# Patient Record
Sex: Male | Born: 1950 | Race: Asian | Hispanic: No | Marital: Married | Smoking: Never smoker
Health system: Southern US, Community
[De-identification: ages and names within clinical notes are randomized; demographics above are authoritative.]

## PROBLEM LIST (undated history)

## (undated) DIAGNOSIS — K635 Polyp of colon: Secondary | ICD-10-CM

## (undated) DIAGNOSIS — K219 Gastro-esophageal reflux disease without esophagitis: Secondary | ICD-10-CM

## (undated) DIAGNOSIS — I10 Essential (primary) hypertension: Secondary | ICD-10-CM

## (undated) HISTORY — DX: Polyp of colon: K63.5

---

## 2009-03-28 ENCOUNTER — Emergency Department (HOSPITAL_COMMUNITY): Admission: EM | Admit: 2009-03-28 | Discharge: 2009-03-28 | Payer: Self-pay | Admitting: Emergency Medicine

## 2012-03-22 ENCOUNTER — Emergency Department (HOSPITAL_BASED_OUTPATIENT_CLINIC_OR_DEPARTMENT_OTHER)
Admission: EM | Admit: 2012-03-22 | Discharge: 2012-03-22 | Disposition: A | Discharge status: Home / Self Care | Payer: Worker's Compensation | Attending: Emergency Medicine | Admitting: Emergency Medicine

## 2012-03-22 ENCOUNTER — Encounter (HOSPITAL_BASED_OUTPATIENT_CLINIC_OR_DEPARTMENT_OTHER): Payer: Self-pay

## 2012-03-22 DIAGNOSIS — S61209A Unspecified open wound of unspecified finger without damage to nail, initial encounter: Secondary | ICD-10-CM | POA: Insufficient documentation

## 2012-03-22 DIAGNOSIS — K219 Gastro-esophageal reflux disease without esophagitis: Secondary | ICD-10-CM | POA: Insufficient documentation

## 2012-03-22 DIAGNOSIS — Y93G1 Activity, food preparation and clean up: Secondary | ICD-10-CM | POA: Insufficient documentation

## 2012-03-22 DIAGNOSIS — I10 Essential (primary) hypertension: Secondary | ICD-10-CM | POA: Insufficient documentation

## 2012-03-22 DIAGNOSIS — S61211A Laceration without foreign body of left index finger without damage to nail, initial encounter: Secondary | ICD-10-CM

## 2012-03-22 DIAGNOSIS — Y998 Other external cause status: Secondary | ICD-10-CM | POA: Insufficient documentation

## 2012-03-22 DIAGNOSIS — W260XXA Contact with knife, initial encounter: Secondary | ICD-10-CM | POA: Insufficient documentation

## 2012-03-22 DIAGNOSIS — W261XXA Contact with sword or dagger, initial encounter: Secondary | ICD-10-CM | POA: Insufficient documentation

## 2012-03-22 HISTORY — DX: Essential (primary) hypertension: I10

## 2012-03-22 HISTORY — DX: Gastro-esophageal reflux disease without esophagitis: K21.9

## 2012-03-22 MED ORDER — HYDROCODONE-ACETAMINOPHEN 5-325 MG PO TABS: 2.0000 | ORAL_TABLET | ORAL | Status: AC | PRN

## 2012-03-22 MED ORDER — GELATIN ABSORBABLE 12-7 MM EX MISC
1.0000 | Freq: Once | CUTANEOUS | Status: AC
  Administered 2012-03-22: 1 via TOPICAL

## 2012-03-22 MED ORDER — GELATIN ABSORBABLE 12-7 MM EX MISC
CUTANEOUS | Status: AC
  Filled 2012-03-22: qty 1

## 2012-03-22 NOTE — ED Notes (Signed)
Laceration to index finger on left hand.

## 2012-03-22 NOTE — ED Notes (Signed)
Sofia PA at bedside.  

## 2012-03-22 NOTE — ED Provider Notes (Signed)
Medical screening examination/treatment/procedure(s) were performed by non-physician practitioner and as supervising physician I was immediately available for consultation/collaboration.   Loren Racer, MD 03/22/12 1451

## 2012-03-22 NOTE — ED Provider Notes (Signed)
History     CSN: [id number]  Arrival date & time 03/22/12  1335   First MD Initiated Contact with Patient 03/22/12 1359      Chief Complaint  Patient presents with  . Extremity Laceration    (Consider location/radiation/quality/duration/timing/severity/associated sxs/prior treatment) Patient is a 61 y.o. male presenting with hand pain. The history is provided by the patient. No language interpreter was used.  Hand Pain This is a new problem. The current episode started today. The problem has been gradually worsening. Nothing aggravates the symptoms. He has tried nothing for the symptoms. The treatment provided no relief.  Pt cut of tip of finger slicing vegetables.   Pt complains of bleeding from tip of finger.  Past Medical History  Diagnosis Date  . Hypertension   . GERD (gastroesophageal reflux disease)     History reviewed. No pertinent past surgical history.  No family history on file.  History  Substance Use Topics  . Smoking status: Never Smoker   . Smokeless tobacco: Not on file  . Alcohol Use: No      Review of Systems  Skin: Positive for wound.  All other systems reviewed and are negative.    Allergies  Review of patient's allergies indicates no known allergies.  Home Medications   Current Outpatient Rx  Name Route Sig Dispense Refill  . OMEPRAZOLE 20 MG PO CPDR Oral Take 20 mg by mouth daily.      BP 134/72  Pulse 59  Temp 98.3 F (36.8 C) (Oral)  Resp 16  Ht 5\' 3"  (1.6 m)  Wt 155 lb (70.308 kg)  BMI 27.46 kg/m2  SpO2 98%  Physical Exam  Nursing note and vitals reviewed. Constitutional: He appears well-developed and well-nourished.  Musculoskeletal:       7mm avulsed area tip of left index finger gapping, nv and ns intact  Neurological: He is alert.  Skin: Skin is warm.       7mm avulsion laceration distal left index finger  Psychiatric: He has a normal mood and affect.    ED Course  Procedures (including critical care  time)  Labs Reviewed - No data to display No results found.   No diagnosis found.    MDM  Gelfoam to tip of finger.         Lonia Skinner [place], [place] 03/22/12 1407  Lonia Skinner [place], [place] 03/22/12 [phone]

## 2015-05-13 ENCOUNTER — Encounter: Payer: Self-pay | Admitting: Family Medicine

## 2015-11-07 ENCOUNTER — Emergency Department (HOSPITAL_BASED_OUTPATIENT_CLINIC_OR_DEPARTMENT_OTHER): Payer: BLUE CROSS/BLUE SHIELD

## 2015-11-07 ENCOUNTER — Emergency Department (HOSPITAL_BASED_OUTPATIENT_CLINIC_OR_DEPARTMENT_OTHER)
Admission: EM | Admit: 2015-11-07 | Discharge: 2015-11-07 | Disposition: A | Discharge status: Home / Self Care | Payer: BLUE CROSS/BLUE SHIELD | Attending: Emergency Medicine | Admitting: Emergency Medicine

## 2015-11-07 ENCOUNTER — Encounter (HOSPITAL_BASED_OUTPATIENT_CLINIC_OR_DEPARTMENT_OTHER): Payer: Self-pay | Admitting: *Deleted

## 2015-11-07 DIAGNOSIS — I10 Essential (primary) hypertension: Secondary | ICD-10-CM | POA: Diagnosis not present

## 2015-11-07 DIAGNOSIS — K219 Gastro-esophageal reflux disease without esophagitis: Secondary | ICD-10-CM | POA: Insufficient documentation

## 2015-11-07 DIAGNOSIS — R1012 Left upper quadrant pain: Secondary | ICD-10-CM | POA: Diagnosis present

## 2015-11-07 DIAGNOSIS — Z79899 Other long term (current) drug therapy: Secondary | ICD-10-CM | POA: Diagnosis not present

## 2015-11-07 DIAGNOSIS — K2970 Gastritis, unspecified, without bleeding: Secondary | ICD-10-CM

## 2015-11-07 LAB — CBC WITH DIFFERENTIAL/PLATELET
BASOS PCT: 0 %
Basophils Absolute: 0 10*3/uL (ref 0.0–0.1)
Eosinophils Absolute: 0.1 10*3/uL (ref 0.0–0.7)
Eosinophils Relative: 1 %
HEMATOCRIT: 41.8 % (ref 39.0–52.0)
HEMOGLOBIN: 14.9 g/dL (ref 13.0–17.0)
LYMPHS ABS: 1.8 10*3/uL (ref 0.7–4.0)
Lymphocytes Relative: 32 %
MCH: 30 pg (ref 26.0–34.0)
MCHC: 35.6 g/dL (ref 30.0–36.0)
MCV: 84.3 fL (ref 78.0–100.0)
MONOS PCT: 8 %
Monocytes Absolute: 0.5 10*3/uL (ref 0.1–1.0)
NEUTROS ABS: 3.3 10*3/uL (ref 1.7–7.7)
NEUTROS PCT: 59 %
Platelets: 186 10*3/uL (ref 150–400)
RBC: 4.96 MIL/uL (ref 4.22–5.81)
RDW: 13 % (ref 11.5–15.5)
WBC: 5.6 10*3/uL (ref 4.0–10.5)

## 2015-11-07 LAB — COMPREHENSIVE METABOLIC PANEL
ALT: 26 U/L (ref 17–63)
ANION GAP: 9 (ref 5–15)
AST: 24 U/L (ref 15–41)
Albumin: 4.4 g/dL (ref 3.5–5.0)
Alkaline Phosphatase: 76 U/L (ref 38–126)
BUN: 12 mg/dL (ref 6–20)
CALCIUM: 8.6 mg/dL — AB (ref 8.9–10.3)
CHLORIDE: 104 mmol/L (ref 101–111)
CO2: 27 mmol/L (ref 22–32)
Creatinine, Ser: 0.67 mg/dL (ref 0.61–1.24)
GFR calc non Af Amer: >60 mL/min (ref >60)
Glucose, Bld: 114 mg/dL — ABNORMAL HIGH (ref 65–99)
POTASSIUM: 3.6 mmol/L (ref 3.5–5.1)
SODIUM: 140 mmol/L (ref 135–145)
Total Bilirubin: 0.7 mg/dL (ref 0.3–1.2)
Total Protein: 7.3 g/dL (ref 6.5–8.1)

## 2015-11-07 LAB — URINALYSIS, ROUTINE W REFLEX MICROSCOPIC
Bilirubin Urine: NEGATIVE
Glucose, UA: NEGATIVE mg/dL
HGB URINE DIPSTICK: NEGATIVE
Ketones, ur: NEGATIVE mg/dL
Leukocytes, UA: NEGATIVE
NITRITE: NEGATIVE
Protein, ur: NEGATIVE mg/dL
SPECIFIC GRAVITY, URINE: 1.004 — AB (ref 1.005–1.030)
pH: 7.5 (ref 5.0–8.0)

## 2015-11-07 LAB — LIPASE, BLOOD: Lipase: 25 U/L (ref 11–51)

## 2015-11-07 LAB — D-DIMER, QUANTITATIVE (NOT AT ARMC)

## 2015-11-07 MED ORDER — IOHEXOL 300 MG/ML  SOLN
25.0000 mL | Freq: Once | INTRAMUSCULAR | Status: AC | PRN
  Administered 2015-11-07: 25 mL via ORAL

## 2015-11-07 MED ORDER — FAMOTIDINE 20 MG PO TABS: 20.0000 mg | ORAL_TABLET | Freq: Two times a day (BID) | ORAL | Status: DC

## 2015-11-07 MED ORDER — IOHEXOL 300 MG/ML  SOLN
100.0000 mL | Freq: Once | INTRAMUSCULAR | Status: AC | PRN
  Administered 2015-11-07: 100 mL via INTRAVENOUS

## 2015-11-07 NOTE — ED Notes (Signed)
Denies any injury to abd/ chest area, onset of discomfort approx 2 days ago.

## 2015-11-07 NOTE — Discharge Instructions (Signed)
Gastritis, Adult Gastritis is soreness and swelling (inflammation) of the lining of the stomach. Gastritis can develop as a sudden onset (acute) or long-term (chronic) condition. If gastritis is not treated, it can lead to stomach bleeding and ulcers. CAUSES  Gastritis occurs when the stomach lining is weak or damaged. Digestive juices from the stomach then inflame the weakened stomach lining. The stomach lining may be weak or damaged due to viral or bacterial infections. One common bacterial infection is the Helicobacter pylori infection. Gastritis can also result from excessive alcohol consumption, taking certain medicines, or having too much acid in the stomach.  SYMPTOMS  In some cases, there are no symptoms. When symptoms are present, they may include:  Pain or a burning sensation in the upper abdomen.  Nausea.  Vomiting.  An uncomfortable feeling of fullness after eating. DIAGNOSIS  Your caregiver may suspect you have gastritis based on your symptoms and a physical exam. To determine the cause of your gastritis, your caregiver may perform the following:  Blood or stool tests to check for the H pylori bacterium.  Gastroscopy. A thin, flexible tube (endoscope) is passed down the esophagus and into the stomach. The endoscope has a light and camera on the end. Your caregiver uses the endoscope to view the inside of the stomach.  Taking a tissue sample (biopsy) from the stomach to examine under a microscope. TREATMENT  Depending on the cause of your gastritis, medicines may be prescribed. If you have a bacterial infection, such as an H pylori infection, antibiotics may be given. If your gastritis is caused by too much acid in the stomach, H2 blockers or antacids may be given. Your caregiver may recommend that you stop taking aspirin, ibuprofen, or other nonsteroidal anti-inflammatory drugs (NSAIDs). HOME CARE INSTRUCTIONS  Only take over-the-counter or prescription medicines as directed by  your caregiver.  If you were given antibiotic medicines, take them as directed. Finish them even if you start to feel better.  Drink enough fluids to keep your urine clear or pale yellow.  Avoid foods and drinks that make your symptoms worse, such as:  Caffeine or alcoholic drinks.  Chocolate.  Peppermint or mint flavorings.  Garlic and onions.  Spicy foods.  Citrus fruits, such as oranges, lemons, or limes.  Tomato-based foods such as sauce, chili, salsa, and pizza.  Fried and fatty foods.  Eat small, frequent meals instead of large meals. SEEK IMMEDIATE MEDICAL CARE IF:   You have black or dark red stools.  You vomit blood or material that looks like coffee grounds.  You are unable to keep fluids down.  Your abdominal pain gets worse.  You have a fever.  You do not feel better after 1 week.  You have any other questions or concerns. MAKE SURE YOU:  Understand these instructions.  Will watch your condition.  Will get help right away if you are not doing well or get worse.   This information is not intended to replace advice given to you by your health care provider. Make sure you discuss any questions you have with your health care provider.   Document Released: 08/23/2001 Document Revised: 02/28/2012 Document Reviewed: 10/12/2011 Elsevier Interactive Patient Education 2016 Else[employer]  Flank Pain Flank pain is pain in your side. The flank is the area of your side between your upper belly (abdomen) and your back. Pain in this area can be caused by many different things. [place] care and treatment will depend on the cause of your  pain.  Rest as told by your doctor.  Drink enough fluids to keep your pee (urine) clear or pale yellow.  Only take medicine as told by your doctor.  Tell your doctor about any changes in your pain.  Follow up with your doctor. GET HELP RIGHT AWAY IF:   Your pain does not get better with medicine.   You have  new symptoms or your symptoms get worse.  Your pain gets worse.   You have belly (abdominal) pain.   You are short of breath.   You always feel sick to your stomach (nauseous).   You keep throwing up (vomiting).   You have puffiness (swelling) in your belly.   You feel light-headed or you pass out (faint).   You have blood in your pee.  You have a fever or lasting symptoms for more than 2-3 days.  You have a fever and your symptoms suddenly get worse. MAKE SURE YOU:   Understand these instructions.  Will watch your condition.  Will get help right away if you are not doing well or get worse.   This information is not intended to replace advice given to you by your health care provider. Make sure you discuss any questions you have with your health care provider.   Document Released: 06/07/2008 Document Revised: 09/19/2014 Document Reviewed: 04/12/2012 Elsevier Interactive Patient Education [employer].

## 2015-11-07 NOTE — ED Notes (Signed)
Presents with pain at base of ribs, left side. Denies any N/V/ D, describes as "pain that is there, but not severe"

## 2015-11-07 NOTE — ED Notes (Signed)
DC instructions and Rx reviewed with pt and son. Opportunity for questions provided

## 2015-11-07 NOTE — ED Notes (Signed)
Appetite good, no changes noted

## 2015-11-07 NOTE — ED Notes (Signed)
MD at bedside. 

## 2015-11-07 NOTE — ED Provider Notes (Signed)
CSN: [id number]     Arrival date & time 11/07/15  [id number] History   First MD Initiated Contact with Patient 11/07/15 [phone]     Chief Complaint  Patient presents with  . Abdominal Pain     (Consider location/radiation/quality/duration/timing/severity/associated sxs/prior Treatment) HPI Comments: Patient with a history of hypertension and gastroesophageal reflux disease presents with abdominal pain. He does speak [place] but his son also helps interpret some questions. He has pain in his left lower rib cage/upper abdomen. He states it started about a day and half ago. He states it's gradually gotten worse but he still states the pain is mild. It's not related to breathing. He has no shortness of breath. No cough or chest congestion. He has no associated nausea or vomiting. He's having normal bowel movements. He denies any fevers. No urinary symptoms. It's not related to eating. He denies any leg pain or swelling. There is no recent travel history or immobilization. No prior history of blood clots. No smoking history. He denies any injuries to the area.  Patient is a 65 y.o. male presenting with abdominal pain.  Abdominal Pain Associated symptoms: no chest pain, no chills, no cough, no diarrhea, no fatigue, no fever, no hematuria, no nausea, no shortness of breath and no vomiting     Past Medical History  Diagnosis Date  . Hypertension   . GERD (gastroesophageal reflux disease)    History reviewed. No pertinent past surgical history. No family history on file. Social History  Substance Use Topics  . Smoking status: Never Smoker   . Smokeless tobacco: None  . Alcohol Use: No    Review of Systems  Constitutional: Negative for fever, chills, diaphoresis and fatigue.  HENT: Negative for congestion, rhinorrhea and sneezing.   Eyes: Negative.   Respiratory: Negative for cough, chest tightness and shortness of breath.   Cardiovascular: Negative for chest pain and leg swelling.   Gastrointestinal: Positive for abdominal pain. Negative for nausea, vomiting, diarrhea and blood in stool.  Genitourinary: Negative for frequency, hematuria, flank pain and difficulty urinating.  Musculoskeletal: Negative for back pain and arthralgias.  Skin: Negative for rash.  Neurological: Negative for dizziness, speech difficulty, weakness, numbness and headaches.      Allergies  Review of patient's allergies indicates no known allergies.  Home Medications   Prior to Admission medications   Medication Sig Start Date End Date Taking? Authorizing Provider  Vitamin D, Ergocalciferol, (DRISDOL) 50000 units CAPS capsule Take 50,000 Units by mouth every 7 (seven) days.   Yes Historical Provider, MD  famotidine (PEPCID) 20 MG tablet Take 1 tablet (20 mg total) by mouth 2 (two) times daily. 11/07/15   Malvin Johns, MD  omeprazole (PRILOSEC) 20 MG capsule Take 20 mg by mouth daily.    Historical Provider, MD   BP 143/87 mmHg  Pulse 60  Temp(Src) 98 F (36.7 C) (Oral)  Resp 18  Ht 5\' 6"  (1.676 m)  Wt 155 lb (70.308 kg)  BMI 25.03 kg/m2  SpO2 98% Physical Exam  Constitutional: He is oriented to person, place, and time. He appears well-developed and well-nourished.  HENT:  Head: Normocephalic and atraumatic.  Eyes: Pupils are equal, round, and reactive to light.  Neck: Normal range of motion. Neck supple.  Cardiovascular: Normal rate, regular rhythm and normal heart sounds.   Pulmonary/Chest: Effort normal and breath sounds normal. No respiratory distress. He has no wheezes. He has no rales. He exhibits tenderness (Moderate tenderness to the left lower anterior ribs. No crepitus  or deformity. No signs of external trauma).  Abdominal: Soft. Bowel sounds are normal. There is tenderness (Mild tenderness to the left upper quadrant just under the rib cage.). There is no rebound and no guarding.  Musculoskeletal: Normal range of motion. He exhibits no edema.  Lymphadenopathy:    He has no  cervical adenopathy.  Neurological: He is alert and oriented to person, place, and time.  Skin: Skin is warm and dry. No rash noted.  Psychiatric: He has a normal mood and affect.    ED Course  Procedures (including critical care time) Labs Review Labs Reviewed  COMPREHENSIVE METABOLIC PANEL - Abnormal; Notable for the following:    Glucose, Bld 114 (*)    Calcium 8.6 (*)    All other components within normal limits  URINALYSIS, ROUTINE W REFLEX MICROSCOPIC (NOT AT [facility]) - Abnormal; Notable for the following:    Specific Gravity, Urine 1.004 (*)    All other components within normal limits  CBC WITH DIFFERENTIAL/PLATELET  LIPASE, BLOOD  D-DIMER, QUANTITATIVE (NOT AT [facility])    Imaging Review Dg Ribs Unilateral W/chest Left  11/07/2015  CLINICAL DATA:  Left anterior lower rib pain for 2 days. EXAM: RIGHT RIBS AND CHEST - 3+ VIEW COMPARISON:  None. FINDINGS: Heart is normal size. Tortuosity of the thoracic aorta. Lungs are clear. No effusions or pneumothorax. No visible rib fracture. IMPRESSION: No acute findings. Electronically Signed   By: Rolm Baptise M.D.   On: 11/07/2015 09:43   Ct Abdomen Pelvis W Contrast  11/07/2015  CLINICAL DATA:  Left upper quadrant pain for 2 days. EXAM: CT ABDOMEN AND PELVIS WITH CONTRAST TECHNIQUE: Multidetector CT imaging of the abdomen and pelvis was performed using the standard protocol following bolus administration of intravenous contrast. CONTRAST:  19mL OMNIPAQUE IOHEXOL 300 MG/ML SOLN, 91mL OMNIPAQUE IOHEXOL 300 MG/ML SOLN COMPARISON:  None. FINDINGS: Lower chest:  Lung bases are clear. Hepatobiliary: Multiple punctate low-density structures throughout the liver. Many are too small to characterize but probably represent tiny cysts. Normal appearance of the gallbladder and there is no significant biliary dilatation. Portal venous system is patent. Pancreas: Normal appearance of the pancreas without inflammation or duct dilatation. Spleen: Normal  appearance of the spleen without enlargement. Adrenals/Urinary Tract: Normal adrenal glands. 1.8 cm exophytic cyst in the right kidney upper pole. 1 cm cystic structure in the right kidney interpolar region is slightly dense. 0.7 cm stone in the right kidney lower pole without hydronephrosis. Normal appearance of the urinary bladder. Evidence for multiple small cysts in left kidney without hydronephrosis or stones. Stomach/Bowel: Mild wall thickening at the gastric cardia but this could be related to incomplete distention. However, the distal stomach and duodenal bulb region are distended and there is mild wall thickening, measuring close to 4 mm. There is no significant edema or stranding around the duodenal bulb. Vascular/Lymphatic: No significant atherosclerotic disease in the aorta or iliac arteries. There is no significant abdominal or pelvic lymphadenopathy. Reproductive: No gross abnormality to the prostate or seminal vesicles. Other: No free fluid.  No free air. Musculoskeletal:  No suspicious bone findings. IMPRESSION: Mild wall thickening in the region the distal stomach and duodenal bulb. There may also be mild wall thickening near the gastric cardia. Findings could be associated with gastritis and/or duodenitis. Nonobstructive right kidney stone. Multiple low density structures scattered throughout the liver and kidneys. Some of these structures are indeterminate or too small to characterize but probably represent multiple cysts. Electronically Signed   By: Quita Skye  Anselm Pancoast M.D.   On: 11/07/2015 10:57   I have personally reviewed and evaluated these images and lab results as part of my medical decision-making.   EKG Interpretation None      MDM   Final diagnoses:  Left upper quadrant pain  Gastritis    Patient presents with left upper abdominal pain associated left lower rib pain. There is no evidence of injury to the ribs. No evidence of mass. His d-dimer is negative. I had a discussion with  him about doing a CT scan and patient feel better if we went ahead and images abdomen. He did have a CT scan which showed some signs of gastritis. There is no other significant abnormalities. He was discharged home in good condition. His labs are unremarkable. He was given a prescription for Pepcid to use in addition to the Prilosec for a few days. He was encouraged to use a bland diet. He was advised to follow-up with his PCP and return here as needed for any worsening symptoms.    Malvin Johns, MD 11/07/15 [phone]

## 2015-11-09 ENCOUNTER — Emergency Department (HOSPITAL_BASED_OUTPATIENT_CLINIC_OR_DEPARTMENT_OTHER)
Admission: EM | Admit: 2015-11-09 | Discharge: 2015-11-09 | Disposition: A | Discharge status: Home / Self Care | Payer: BLUE CROSS/BLUE SHIELD | Attending: Emergency Medicine | Admitting: Emergency Medicine

## 2015-11-09 ENCOUNTER — Encounter (HOSPITAL_BASED_OUTPATIENT_CLINIC_OR_DEPARTMENT_OTHER): Payer: Self-pay | Admitting: *Deleted

## 2015-11-09 DIAGNOSIS — I10 Essential (primary) hypertension: Secondary | ICD-10-CM | POA: Insufficient documentation

## 2015-11-09 DIAGNOSIS — R1012 Left upper quadrant pain: Secondary | ICD-10-CM | POA: Insufficient documentation

## 2015-11-09 DIAGNOSIS — K219 Gastro-esophageal reflux disease without esophagitis: Secondary | ICD-10-CM | POA: Insufficient documentation

## 2015-11-09 DIAGNOSIS — Z79899 Other long term (current) drug therapy: Secondary | ICD-10-CM | POA: Diagnosis not present

## 2015-11-09 DIAGNOSIS — R1902 Left upper quadrant abdominal swelling, mass and lump: Secondary | ICD-10-CM | POA: Insufficient documentation

## 2015-11-09 MED ORDER — IBUPROFEN 400 MG PO TABS: 400.0000 mg | ORAL_TABLET | Freq: Three times a day (TID) | ORAL | Status: DC | PRN

## 2015-11-09 NOTE — ED Provider Notes (Signed)
CSN: [id number]     Arrival date & time 11/09/15  [id number] History   First MD Initiated Contact with Patient 11/09/15 [phone]     Chief Complaint  Patient presents with  . Abdominal Pain      HPI Patient was seen in emergency department 2 days ago for left upper quadrant pain and underwent x-ray and CT imaging which demonstrate no pathology the patient was put on Pepcid.  He continues to feel a small area of tenderness and mass in his left upper quadrant when he palpates.  He reports this is more prominent at nighttime.  He is able do his normal activities during the day.  He denies fevers and chills.  Denies nausea vomiting diarrhea.  Denies abdominal pain anywhere else.  He is able localize his pain to a focal area.  No recent trauma or injury.  No recent exercise.  His son feels like he can feel small mass in his abdominal wall right where the patient is having discomfort and pain.   Past Medical History  Diagnosis Date  . Hypertension   . GERD (gastroesophageal reflux disease)    History reviewed. No pertinent past surgical history. No family history on file. Social History  Substance Use Topics  . Smoking status: Never Smoker   . Smokeless tobacco: None  . Alcohol Use: No    Review of Systems  All other systems reviewed and are negative.     Allergies  Review of patient's allergies indicates no known allergies.  Home Medications   Prior to Admission medications   Medication Sig Start Date End Date Taking? Authorizing Provider  amLODipine (NORVASC) 5 MG tablet Take 5 mg by mouth daily.   Yes Historical Provider, MD  famotidine (PEPCID) 20 MG tablet Take 1 tablet (20 mg total) by mouth 2 (two) times daily. 11/07/15  Yes Malvin Johns, MD  omeprazole (PRILOSEC) 20 MG capsule Take 20 mg by mouth daily.   Yes Historical Provider, MD  Vitamin D, Ergocalciferol, (DRISDOL) 50000 units CAPS capsule Take 50,000 Units by mouth every 7 (seven) days.   Yes Historical Provider, MD   ibuprofen (ADVIL,MOTRIN) 400 MG tablet Take 1 tablet (400 mg total) by mouth every 8 (eight) hours as needed. 11/09/15   Jola Schmidt, MD   BP 130/76 mmHg  Pulse 56  Temp(Src) 97.5 F (36.4 C) (Oral)  Resp 18  Ht 5\' 7"  (1.702 m)  Wt 155 lb (70.308 kg)  BMI 24.27 kg/m2  SpO2 100% Physical Exam  Constitutional: He is oriented to person, place, and time. He appears well-developed and well-nourished.  HENT:  Head: Normocephalic.  Eyes: EOM are normal.  Neck: Normal range of motion.  Pulmonary/Chest: Effort normal.  Abdominal: Soft. He exhibits no distension. There is no rebound and no guarding.  Small focal swelling noted in the left upper quadrant worse with sitting for.  This does not feel masslike.  No overlying skin changes.  Musculoskeletal: Normal range of motion.  Neurological: He is alert and oriented to person, place, and time.  Psychiatric: He has a normal mood and affect.  Nursing note and vitals reviewed.   ED Course  Procedures (including critical care time)  ULTRASOUND LIMITED SOFT TISSUE/ MUSCULOSKELETAL:  Indication: abdominal wall pain Linear probe used to evaluate area of interest in two planes. Findings:  Small hyperechoic focus noted without discrete borders.  Does not appear consistent with a mass No abscess noted Performed by: Dr Venora Maples Images saved electronically  [place] -  No data to display  Imaging Review  Ct Abdomen Pelvis W Contrast IMPRESSION: Mild wall thickening in the region the distal stomach and duodenal bulb. There may also be mild wall thickening near the gastric cardia. Findings could be associated with gastritis and/or duodenitis. Nonobstructive right kidney stone. Multiple low density structures scattered throughout the liver and kidneys. Some of these structures are indeterminate or too small to characterize but probably represent multiple cysts. Electronically Signed   By: Markus Daft M.D.   On: 11/07/2015 10:57   I  have personally reviewed and evaluated these images and lab results as part of my medical decision-making.   EKG Interpretation None      MDM   Final diagnoses:  Abdominal wall pain in left upper quadrant    Patient symptoms appear to be more abdominal wall in nature.  I personally reviewed the prior CT images which demonstrate no soft tissue abnormalities in this region.  Bedside ultrasound was obtained and a small hyperechoic finding was noted so she with his rectus sheath and abdominal muscles.  This may represent small hematoma.  No abscess was noted.  No overlying skin changes.  Discharge home in good condition.  Anti-inflammatories.  No indication for repeat labs or imaging.    Jola Schmidt, MD 11/09/15 [phone]

## 2015-11-09 NOTE — ED Notes (Signed)
C/o pain left lower rib area. Onset  4 days ago. Has been seen here for same. PT was given pepcid No n/v/d. PT is worse at rest.

## 2016-11-07 IMAGING — CT CT ABD-PELV W/ CM
2 of 5 series · 16 of 46 positions shown, 18 images · IV contrast (APPLIED)
Comparison: None.

CLINICAL DATA: Left upper quadrant pain for 2 days.

EXAM:
CT ABDOMEN AND PELVIS WITH CONTRAST
TECHNIQUE: Multidetector CT imaging of the abdomen and pelvis was performed
using the standard protocol following bolus administration of
intravenous contrast.
CONTRAST:  100mL OMNIPAQUE IOHEXOL 300 MG/ML SOLN, 25mL OMNIPAQUE
IOHEXOL 300 MG/ML SOLN

[Series 2: axial st · axial · 0.82mm/px · z∈[-482,-32]mm · 13 of 102 slices shown, 15 images]
[im 6/102  soft-tissue]
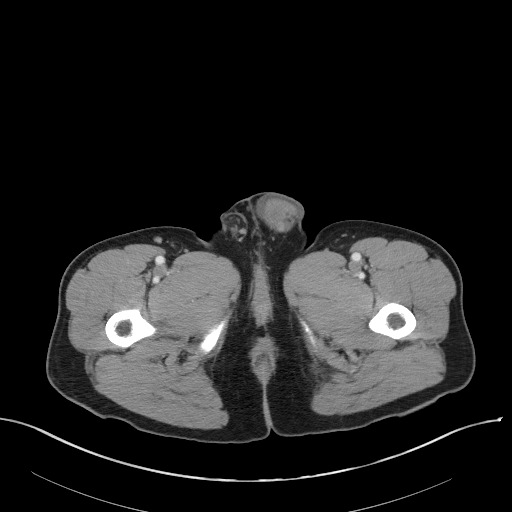
[im 6/102  bone]
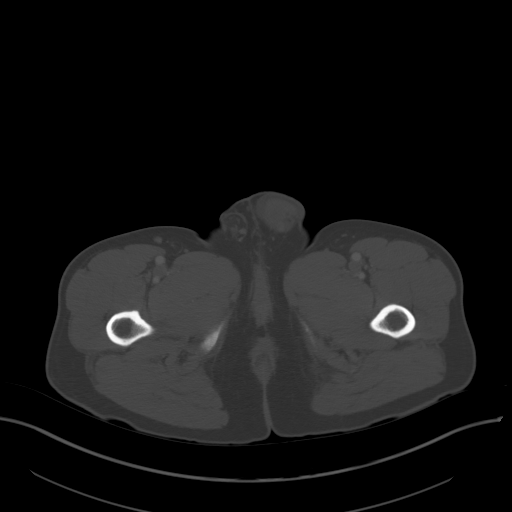
[im 16/102  soft-tissue]
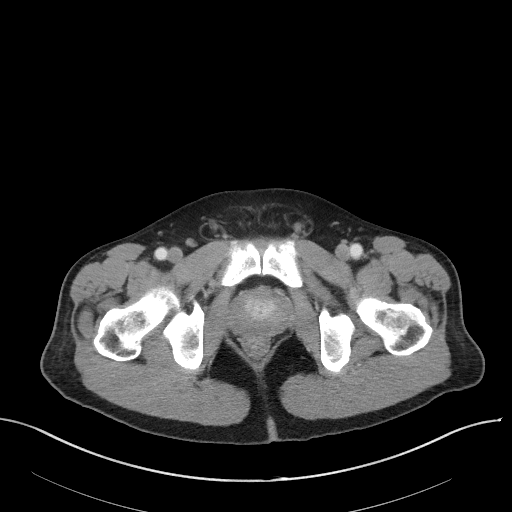
[im 21/102  soft-tissue]
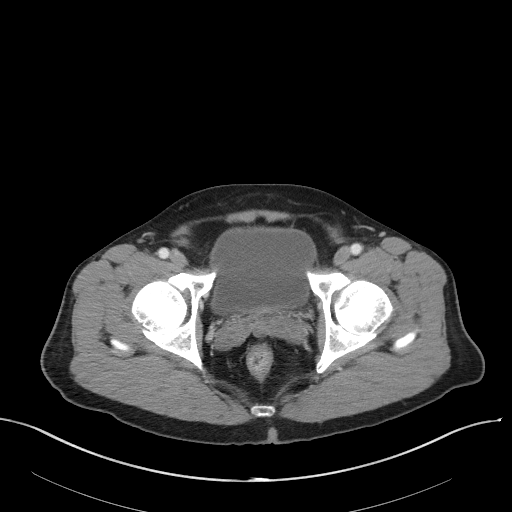
[im 31/102  soft-tissue]
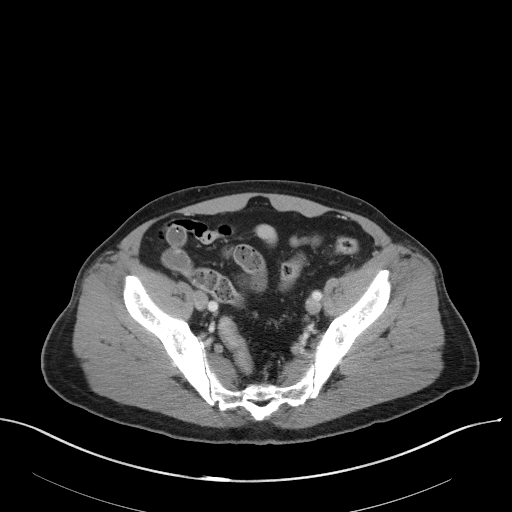
[im 36/102  soft-tissue]
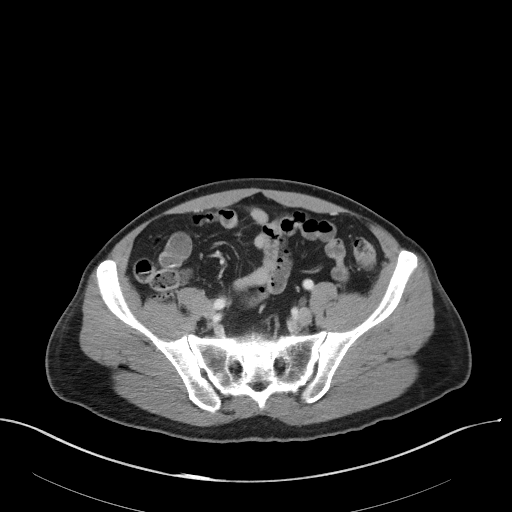
[im 46/102  soft-tissue]
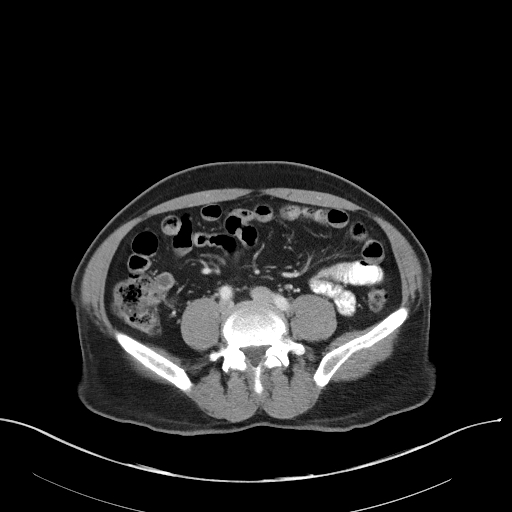
[im 51/102  soft-tissue]
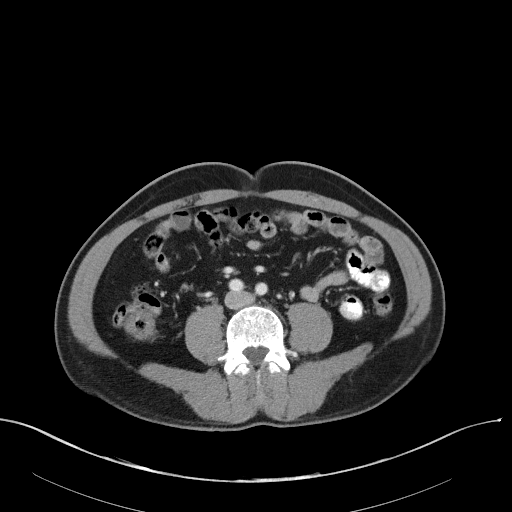
[im 56/102  soft-tissue]
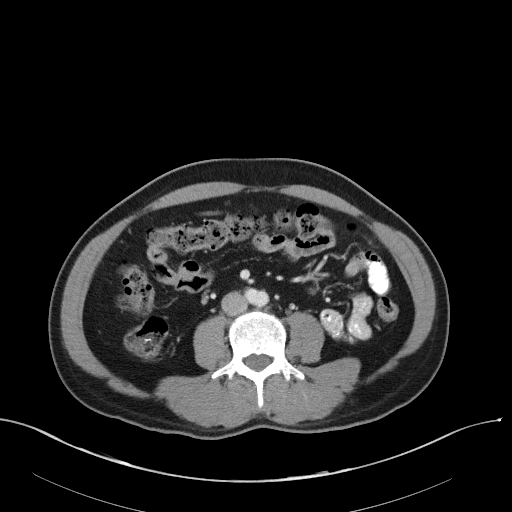
[im 66/102  soft-tissue]
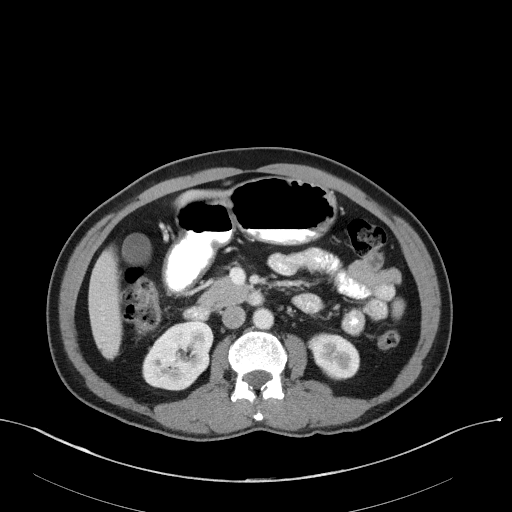
[im 66/102  bone]
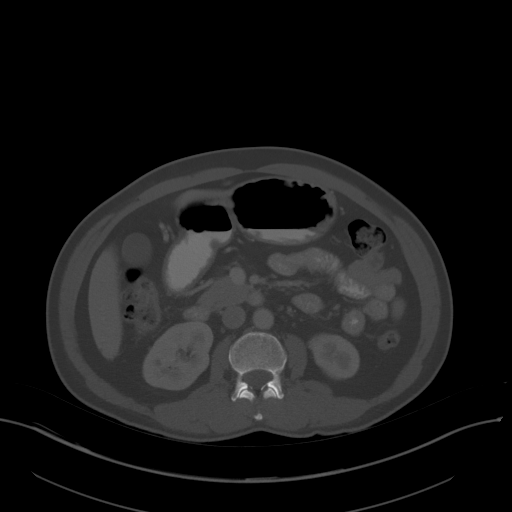
[im 71/102  soft-tissue]
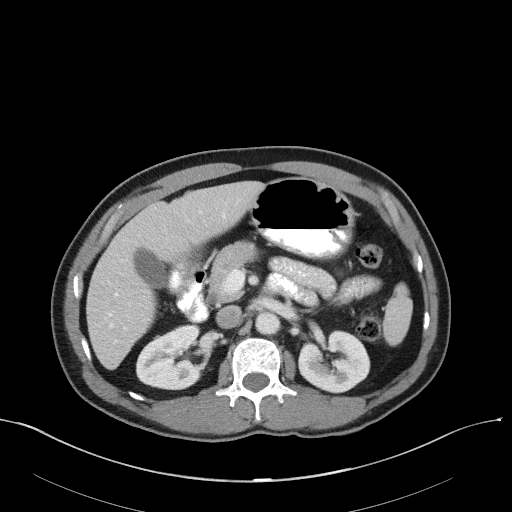
[im 81/102  soft-tissue]
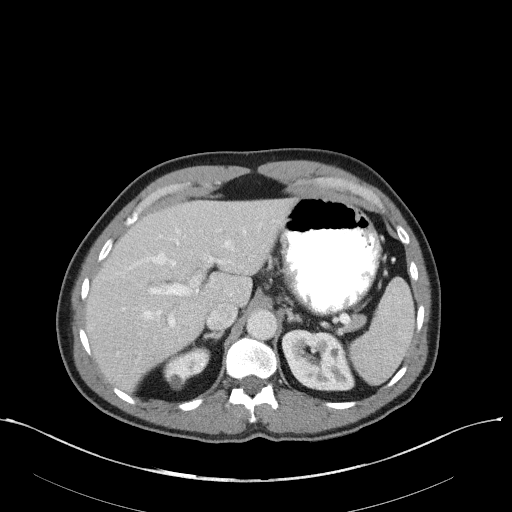
[im 86/102  soft-tissue]
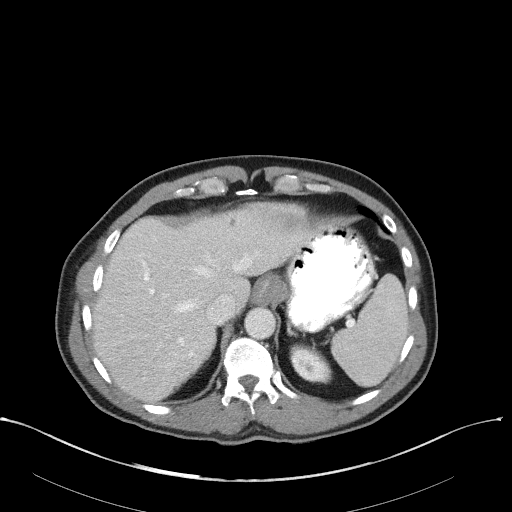
[im 96/102  soft-tissue]
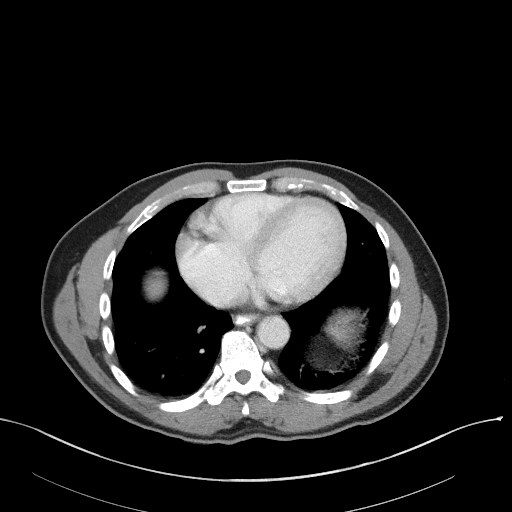

[Series 5: coronal st · coronal · 0.85mm/px · 3 of 74 slices shown]
[im 25/74  soft-tissue]
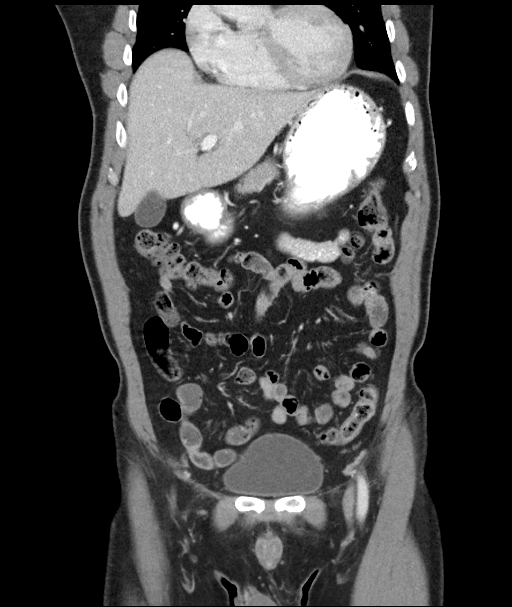
[im 33/74  soft-tissue]
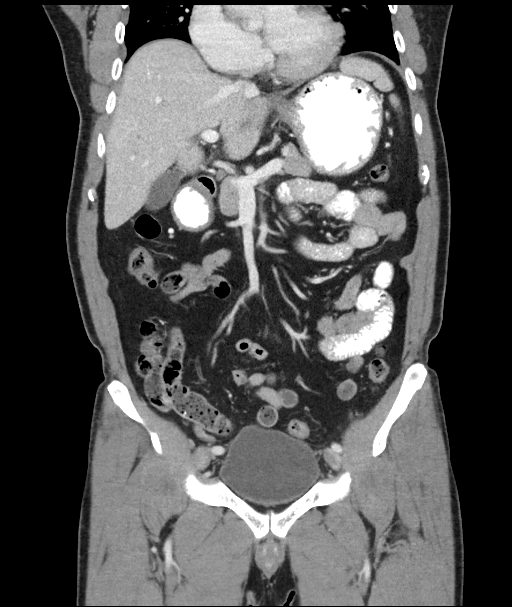
[im 41/74  soft-tissue]
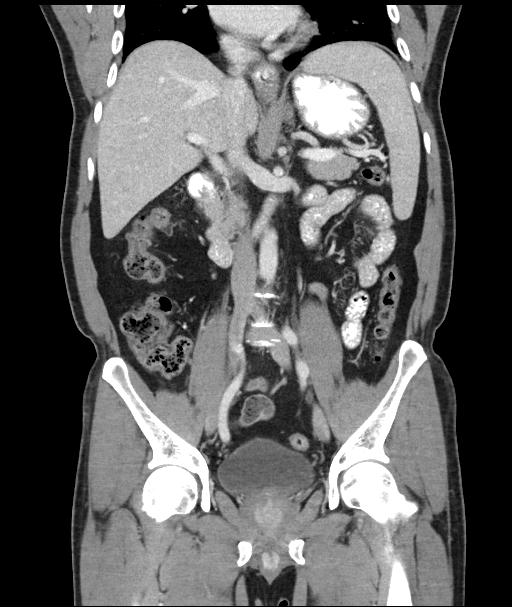

[16 of 46 positions shown; findings below may reference images not displayed]

FINDINGS: Lower chest:  Lung bases are clear.

Hepatobiliary: Multiple punctate low-density structures throughout
the liver. Many are too small to characterize but probably represent
tiny cysts. Normal appearance of the gallbladder and there is no
significant biliary dilatation. Portal venous system is patent.

Pancreas: Normal appearance of the pancreas without inflammation or
duct dilatation.

Spleen: Normal appearance of the spleen without enlargement.

Adrenals/Urinary Tract: Normal adrenal glands. 1.8 cm exophytic cyst
in the right kidney upper pole. 1 cm cystic structure in the right
kidney interpolar region is slightly dense. 0.7 cm stone in the
right kidney lower pole without hydronephrosis. Normal appearance of
the urinary bladder. Evidence for multiple small cysts in left
kidney without hydronephrosis or stones.

Stomach/Bowel: Mild wall thickening at the gastric cardia but this
could be related to incomplete distention. However, the distal
stomach and duodenal bulb region are distended and there is mild
wall thickening, measuring close to 4 mm. There is no significant
edema or stranding around the duodenal bulb.

Vascular/Lymphatic: No significant atherosclerotic disease in the
aorta or iliac arteries. There is no significant abdominal or pelvic
lymphadenopathy.

Reproductive: No gross abnormality to the prostate or seminal
vesicles.

Other: No free fluid.  No free air.

Musculoskeletal:  No suspicious bone findings.
IMPRESSION: Mild wall thickening in the region the distal stomach and duodenal
bulb. There may also be mild wall thickening near the gastric
cardia. Findings could be associated with gastritis and/or
duodenitis.

Nonobstructive right kidney stone.

Multiple low density structures scattered throughout the liver and
kidneys. Some of these structures are indeterminate or too small to
characterize but probably represent multiple cysts.

## 2016-11-07 IMAGING — DX DG RIBS W/ CHEST 3+V*L*
4 series · 4 of 4 positions shown · non-contrast
Comparison: None.

CLINICAL DATA: Left anterior lower rib pain for 2 days.

EXAM:
RIGHT RIBS AND CHEST - 3+ VIEW

[chest pa]
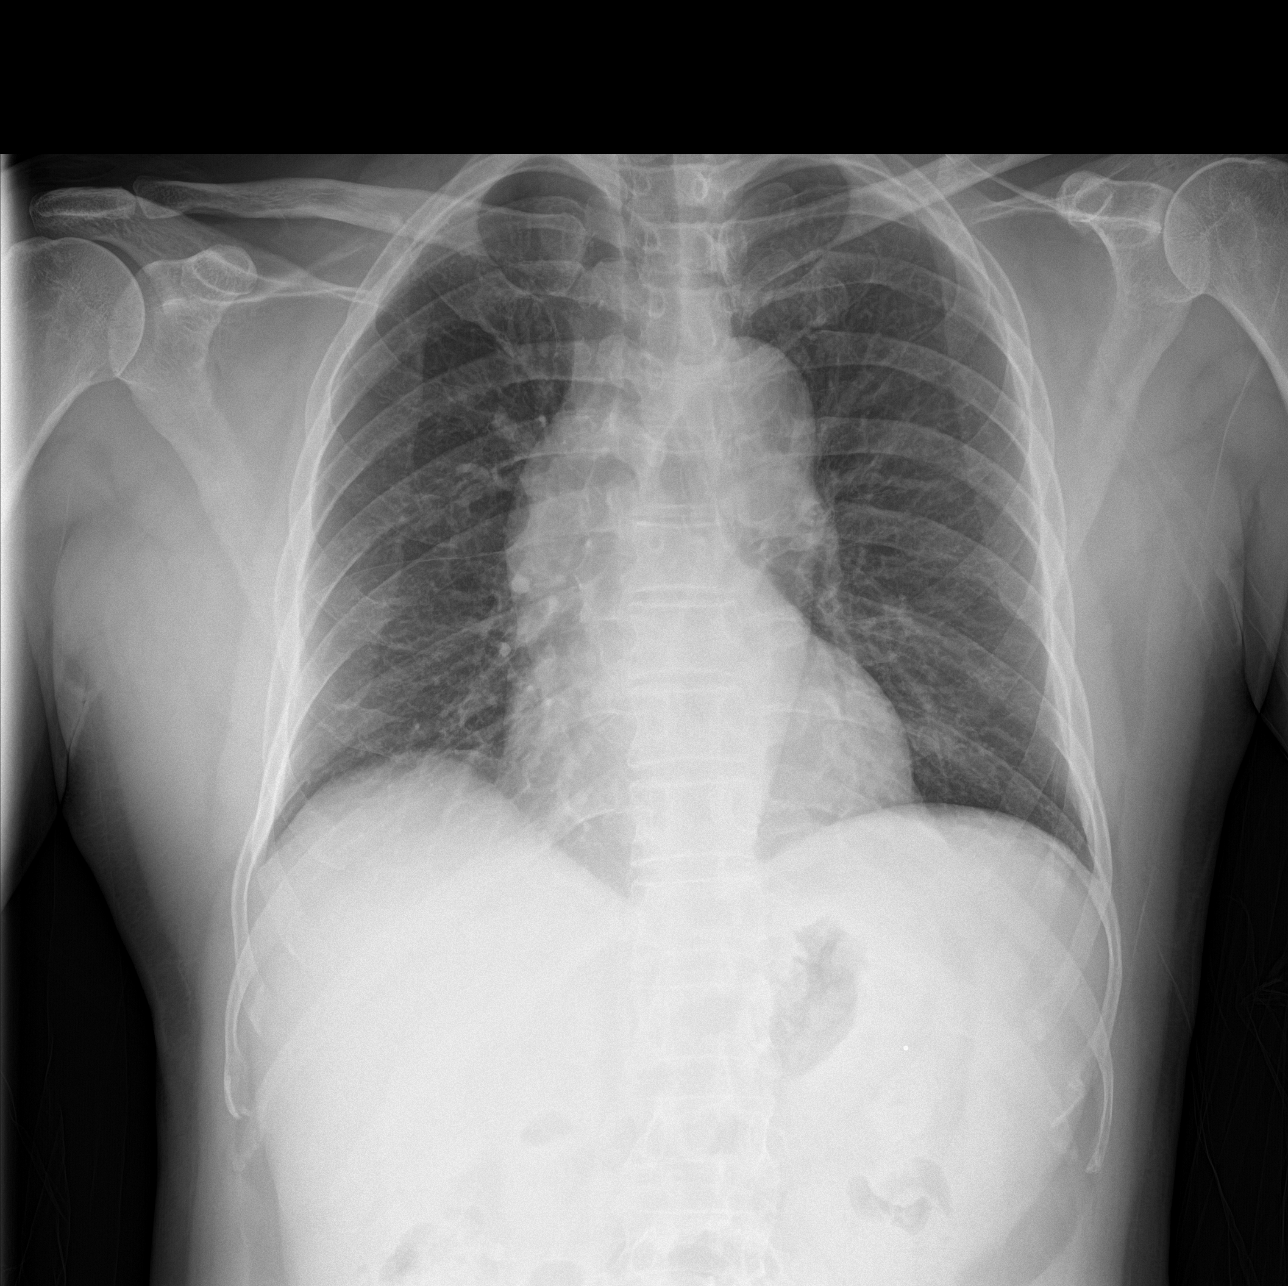

[rib pa obl]
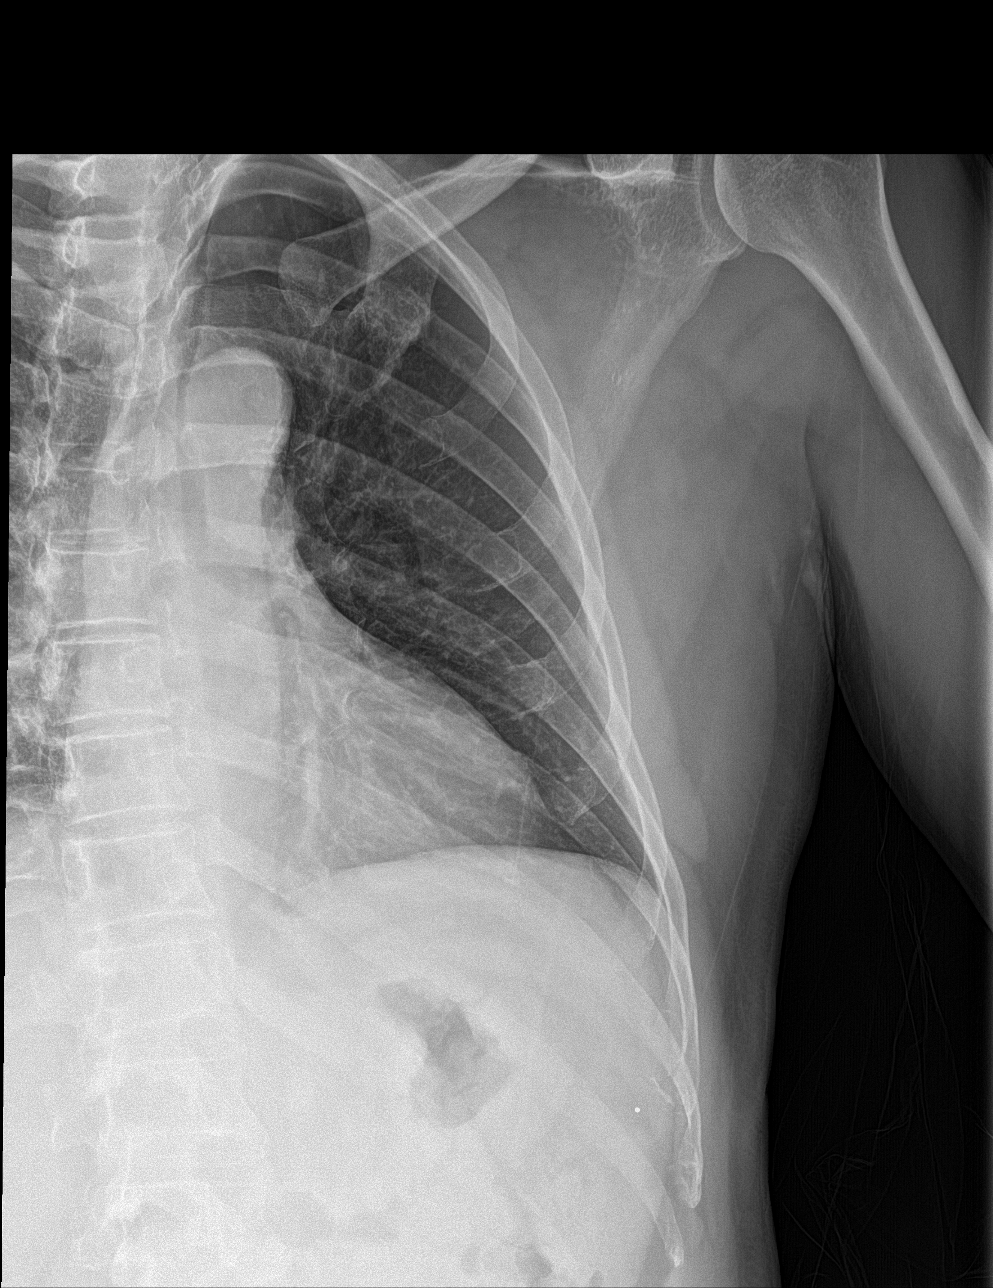

[rib pa (1 of 2)]
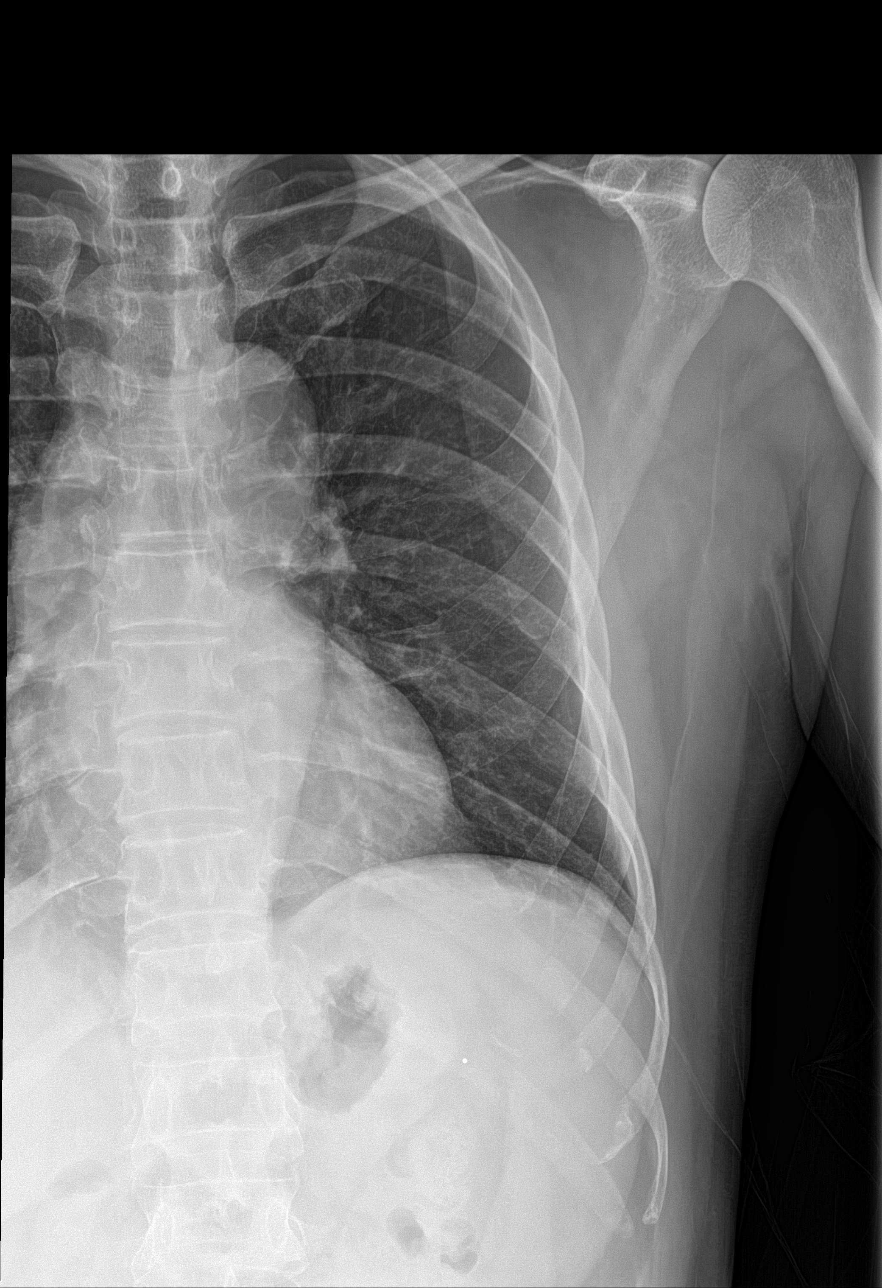

[rib pa (2 of 2)]
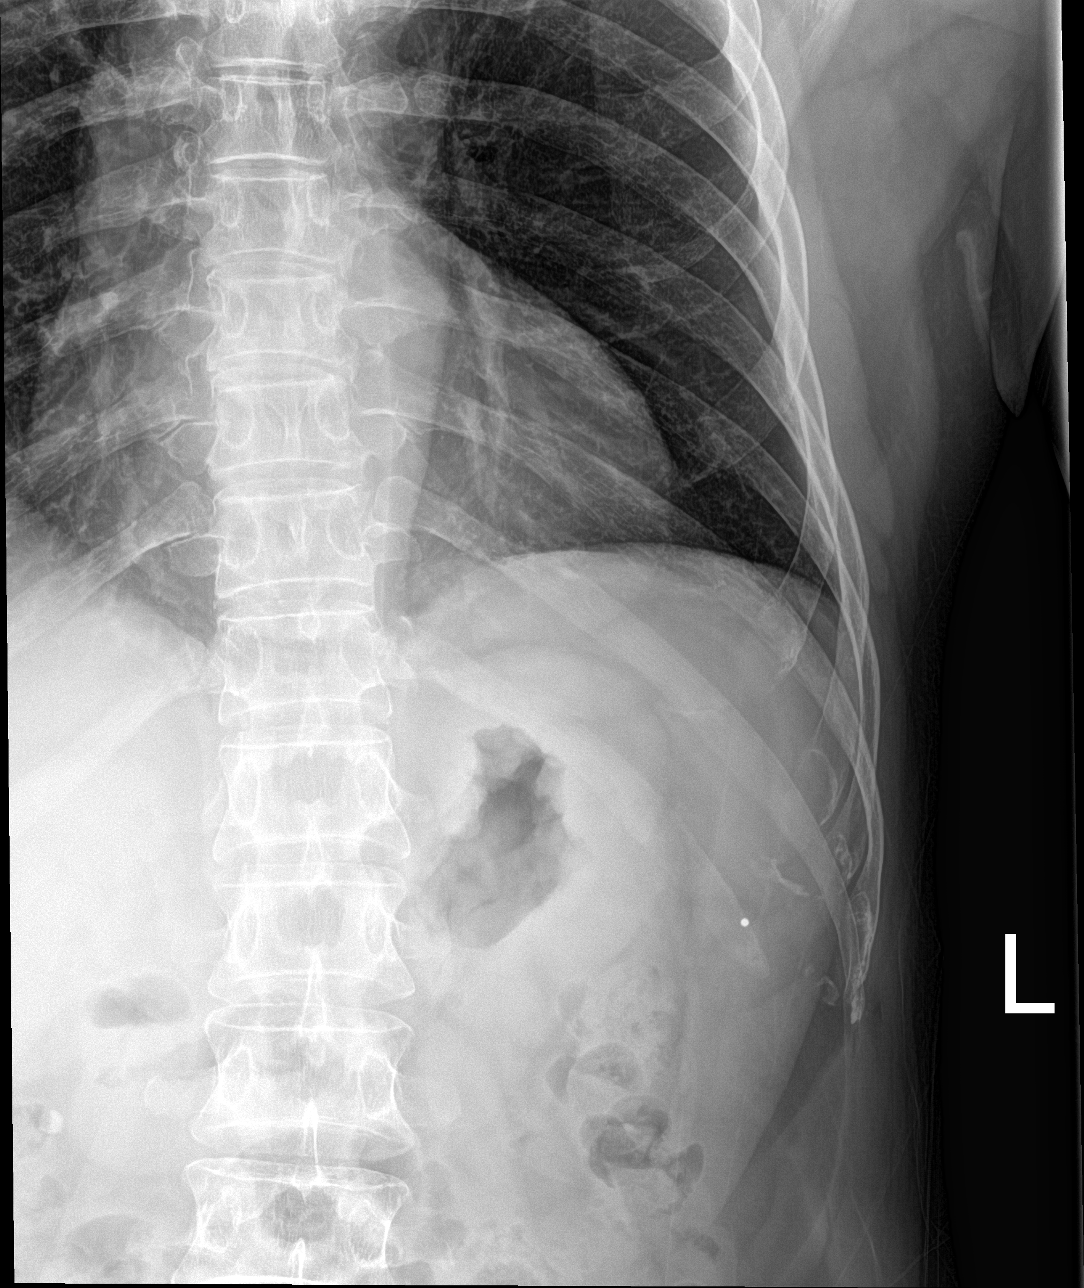

[4 of 4 positions shown; findings below may reference images not displayed]

FINDINGS: Heart is normal size. Tortuosity of the thoracic aorta. Lungs are
clear. No effusions or pneumothorax. No visible rib fracture.
IMPRESSION: No acute findings.

## 2020-05-29 ENCOUNTER — Encounter: Payer: Self-pay | Admitting: Gastroenterology

## 2020-06-01 ENCOUNTER — Telehealth: Payer: Self-pay | Admitting: Gastroenterology

## 2020-06-01 NOTE — Telephone Encounter (Signed)
This patient already has an appointment with Dr. Silverio Decamp on 11/15  Please forward records to her.

## 2020-06-01 NOTE — Telephone Encounter (Signed)
Hey Dr Loletha Carrow, this pt is requesting for you to review his previous colonoscopy done around 4 years ago, im sending his records to you for review. Please advise on scheduling

## 2020-07-27 ENCOUNTER — Ambulatory Visit (INDEPENDENT_AMBULATORY_CARE_PROVIDER_SITE_OTHER): Payer: Medicare Other | Admitting: Gastroenterology

## 2020-07-27 ENCOUNTER — Encounter: Payer: BLUE CROSS/BLUE SHIELD | Admitting: Gastroenterology

## 2020-07-27 ENCOUNTER — Encounter: Payer: Self-pay | Admitting: Gastroenterology

## 2020-07-27 VITALS — BP 100/62 | HR 85 | Ht 67.0 in | Wt 155.0 lb

## 2020-07-27 DIAGNOSIS — K219 Gastro-esophageal reflux disease without esophagitis: Secondary | ICD-10-CM | POA: Diagnosis not present

## 2020-07-27 DIAGNOSIS — Z1211 Encounter for screening for malignant neoplasm of colon: Secondary | ICD-10-CM

## 2020-07-27 MED ORDER — SUPREP BOWEL PREP KIT 17.5-3.13-1.6 GM/177ML PO SOLN: 1.0000 | Freq: Once | ORAL | 0 refills | Status: AC

## 2020-07-27 MED ORDER — ONDANSETRON HCL 4 MG PO TABS: 4.0000 mg | ORAL_TABLET | Freq: Three times a day (TID) | ORAL | 0 refills | Status: AC | PRN

## 2020-07-27 NOTE — Progress Notes (Addendum)
Kyle Clay    [id number]    24-Nov-1950  Primary Care Physician:Patient, No Pcp Per  Referring Physician: Jonathon Resides, MD [address] [place],  [place] [address]   Chief complaint:  GERD, colon ca screening  HPI:  69 year old male with history of chronic GERD and colon polyps here for new patient visit to establish care and discuss EGD and colonoscopy.  He is accompanied by his son for this visit.  Patient was supposed to be scheduled with Dr. Loletha Carrow but due to scheduling error he came in this morning for visit with me.  Patient did not want wish to reschedule his office appointment.  Denies any dysphagia, odynophagia, nausea, vomiting, abdominal pain, melena or bright red blood per rectum. No decreased appetite or unintentional weight loss.  He does not recall if he has ever had H. pylori infection.  GERD symptoms currently under control with daily omeprazole.  Last colonoscopy and EGD in [place] July 20, 2015 Small hiatal hernia, grade a esophagitis.  Gastritis s/p biopsy and duodenitis.  Antral biopsy showed focal erosive gastritis, reactive gastropathy with prominent intestinal metaplasia.  Negative for H. pylori bacteria. 6 mm gastric polyp removed that was hyperplastic.  2 sessile polyps removed from descending and sigmoid colon, one of them was adenoma and the other was hyperplastic.  Internal hemorrhoids otherwise normal.  Recommended recall colonoscopy in 5 years.  CT abdomen pelvis with contrast November 07, 2015: Mild wall thickening in the distal stomach and duodenal bulb, also has mild wall thickening near the gastric cardia possible gastritis and duodenitis otherwise no mass lesion.  Nonobstructive right kidney stone  Family history positive for esophageal cancer in father and history of colon polyps in mother  Outpatient Encounter Medications as of 07/27/2020  Medication Sig   amLODipine (NORVASC) 5 MG tablet Take 5 mg by mouth daily.    omeprazole (PRILOSEC) 20 MG capsule Take 20 mg by mouth daily.   Vitamin D, Ergocalciferol, (DRISDOL) 50000 units CAPS capsule Take 50,000 Units by mouth every 7 (seven) days.   [DISCONTINUED] famotidine (PEPCID) 20 MG tablet Take 1 tablet (20 mg total) by mouth 2 (two) times daily. (Patient not taking: Reported on 07/27/2020)   [DISCONTINUED] ibuprofen (ADVIL,MOTRIN) 400 MG tablet Take 1 tablet (400 mg total) by mouth every 8 (eight) hours as needed. (Patient not taking: Reported on 07/27/2020)   No facility-administered encounter medications on file as of 07/27/2020.    Allergies as of 07/27/2020   (No Known Allergies)    Past Medical History:  Diagnosis Date   Colon polyp    GERD (gastroesophageal reflux disease)    Hypertension     No past surgical history on file.  Family History  Problem Relation Age of Onset   Esophageal cancer Father    Colon polyps Mother    Colon cancer Neg Hx    Pancreatic cancer Neg Hx     Social History   Socioeconomic History   Marital status: Married    Spouse name: Not on file   Number of children: Not on file   Years of education: Not on file   Highest education level: Not on file  Occupational History   Not on file  Tobacco Use   Smoking status: Never Smoker   Smokeless tobacco: Never Used  Substance and Sexual Activity   Alcohol use: No   Drug use: No   Sexual activity: Not on file  Other Topics Concern   Not on file  Social History Narrative   Not on file   Social Determinants of Health   Financial Resource Strain:    Difficulty of Paying Living Expenses: Not on file  Food Insecurity:    Worried About [place] in the Last Year: Not on file   Ran Out of Food in the Last Year: Not on file  Transportation Needs:    Lack of Transportation (Medical): Not on file   Lack of Transportation (Non-Medical): Not on file  Physical Activity:    Days of Exercise per Week: Not on file    Minutes of Exercise per Session: Not on file  Stress:    Feeling of Stress : Not on file  Social Connections:    Frequency of Communication with Friends and Family: Not on file   Frequency of Social Gatherings with Friends and Family: Not on file   Attends Religious Services: Not on file   Active Member of Clubs or Organizations: Not on file   Attends Archivist Meetings: Not on file   Marital Status: Not on file  Intimate Partner Violence:    Fear of Current or Ex-Partner: Not on file   Emotionally Abused: Not on file   Physically Abused: Not on file   Sexually Abused: Not on file      Review of systems: All other review of systems negative except as mentioned in the HPI.   Physical Exam: Vitals:   07/27/20 0900  BP: 100/62  Pulse: 85  SpO2: 98%   Body mass index is 24.28 kg/m. Gen:      No acute distress HEENT:  sclera anicteric Abd:      soft, non-tender; no palpable masses, no distension Ext:    No edema Neuro: alert and oriented x 3 Psych: normal mood and affect  Data Reviewed:  Reviewed labs, radiology imaging, old records and pertinent past GI work up   Assessment and Plan/Recommendations: 69 year old male with chronic GERD, erosive esophagitis, hiatal hernia, gastroduodenitis with gastric antral intestinal metaplasia and colon polyps  He had nonspecific mild wall thickening in the distal stomach and duodenal bulb on CT abdomen pelvis in 2017 also had gastric antral intestinal metaplasia on biopsies in 2016 We will plan to proceed with EGD for surveillance and repeat biopsies as needed  History of adenomatous colon polyps: Due for surveillance colonoscopy, will schedule colonoscopy along with EGD  The risks and benefits as well as alternatives of endoscopic procedure(s) have been discussed and reviewed. All questions answered. The patient agrees to proceed.  GERD and hiatal hernia: Stable, continue omeprazole 20 mg daily and antireflux  measures   The patient was provided an opportunity to ask questions and all were answered. The patient agreed with the plan and demonstrated an understanding of the instructions.  I do not have any available [place] appointments on Mondays before the end of the year, per patient request he is scheduled with Dr. Loletha Carrow given he has available appointments  K. Denzil Magnuson , MD    CC: Jonathon Resides, MD

## 2020-07-27 NOTE — Patient Instructions (Signed)
You have been scheduled for an endoscopy and colonoscopy. Please follow the written instructions given to you at your visit today. Please pick up your prep supplies at the pharmacy within the next 1-3 days. If you use inhalers (even only as needed), please bring them with you on the day of your procedure. (With Dr Loletha Carrow)   We will send 4 Zofran tablets to your pharmacy  If you are age 67 or older, your body mass index should be between 23-30. Your Body mass index is 24.28 kg/m. If this is out of the aforementioned range listed, please consider follow up with your Primary Care Provider.  If you are age 62 or younger, your body mass index should be between 19-25. Your Body mass index is 24.28 kg/m. If this is out of the aformentioned range listed, please consider follow up with your Primary Care Provider.    Due to recent changes in healthcare laws, you may see the results of your imaging and laboratory studies on MyChart before your provider has had a chance to review them.  We understand that in some cases there may be results that are confusing or concerning to you. Not all laboratory results come back in the same time frame and the provider may be waiting for multiple results in order to interpret others.  Please give [place] 48 hours in order for your provider to thoroughly review all the results before contacting the office for clarification of your results.   I appreciate the  opportunity to care for you  Thank You   Harl Bowie , MD

## 2020-07-28 NOTE — Progress Notes (Signed)
Thanks for the note - I will be glad to see him.  - HD

## 2020-08-03 ENCOUNTER — Encounter: Payer: BLUE CROSS/BLUE SHIELD | Admitting: Gastroenterology

## 2020-08-31 ENCOUNTER — Other Ambulatory Visit: Payer: Self-pay

## 2020-08-31 ENCOUNTER — Ambulatory Visit (AMBULATORY_SURGERY_CENTER): Payer: Medicare Other | Admitting: Gastroenterology

## 2020-08-31 ENCOUNTER — Encounter: Payer: Self-pay | Admitting: Gastroenterology

## 2020-08-31 VITALS — BP 122/71 | HR 57 | Temp 97.3°F | Resp 18 | Ht 67.0 in | Wt 155.0 lb

## 2020-08-31 DIAGNOSIS — K3189 Other diseases of stomach and duodenum: Secondary | ICD-10-CM

## 2020-08-31 DIAGNOSIS — K219 Gastro-esophageal reflux disease without esophagitis: Secondary | ICD-10-CM | POA: Diagnosis not present

## 2020-08-31 DIAGNOSIS — K2970 Gastritis, unspecified, without bleeding: Secondary | ICD-10-CM

## 2020-08-31 DIAGNOSIS — K31A0 Gastric intestinal metaplasia, unspecified: Secondary | ICD-10-CM | POA: Diagnosis not present

## 2020-08-31 DIAGNOSIS — K317 Polyp of stomach and duodenum: Secondary | ICD-10-CM | POA: Diagnosis not present

## 2020-08-31 DIAGNOSIS — D122 Benign neoplasm of ascending colon: Secondary | ICD-10-CM | POA: Diagnosis not present

## 2020-08-31 DIAGNOSIS — Z86010 Personal history of colonic polyps: Secondary | ICD-10-CM

## 2020-08-31 MED ORDER — SODIUM CHLORIDE 0.9 % IV SOLN: 500.0000 mL | Freq: Once | INTRAVENOUS | Status: DC

## 2020-08-31 NOTE — Op Note (Signed)
[facility] Patient Name: Kyle Clay Procedure Date: 08/31/2020 3:19 PM MRN: [id number] Endoscopist: Kiron. Loletha Carrow , MD Age: 69 Referring MD:  Date of Birth: August 27, 1951 Gender: Male Account #: [id number] Procedure:                Colonoscopy Indications:              Surveillance: Personal history of adenomatous                            polyps on last colonoscopy > 5 years ago Medicines:                Monitored Anesthesia Care Procedure:                Pre-Anesthesia Assessment:                           - Prior to the procedure, a History and Physical                            was performed, and patient medications and                            allergies were reviewed. The patient's tolerance of                            previous anesthesia was also reviewed. The risks                            and benefits of the procedure and the sedation                            options and risks were discussed with the patient.                            All questions were answered, and informed consent                            was obtained. Prior Anticoagulants: The patient has                            taken no previous anticoagulant or antiplatelet                            agents. ASA Grade Assessment: II - A patient with                            mild systemic disease. After reviewing the risks                            and benefits, the patient was deemed in                            satisfactory condition to undergo the procedure.  After obtaining informed consent, the colonoscope                            was passed under direct vision. Throughout the                            procedure, the patient's blood pressure, pulse, and                            oxygen saturations were monitored continuously. The                            Olympus [id number]) [id number] was introduced                            through the anus and advanced  to the the cecum,                            identified by appendiceal orifice and ileocecal                            valve. The colonoscopy was performed without                            difficulty. The patient tolerated the procedure                            well. The quality of the bowel preparation was                            good. The ileocecal valve, appendiceal orifice, and                            rectum were photographed. Scope In: 3:27:41 PM Scope Out: 3:43:40 PM Scope Withdrawal Time: 0 hours 11 minutes 40 seconds  Total Procedure Duration: 0 hours 15 minutes 59 seconds  Findings:                 The perianal and digital rectal examinations were                            normal.                           Two flat polyps were found in the ascending colon.                            The polyps were diminutive in size. These polyps                            were removed with a cold biopsy forceps. Resection                            and retrieval were complete.  The exam was otherwise without abnormality on                            direct and retroflexion views. Complications:            No immediate complications. Estimated Blood Loss:     Estimated blood loss was minimal. Impression:               - Two diminutive polyps in the ascending colon,                            removed with a cold biopsy forceps. Resected and                            retrieved.                           - The examination was otherwise normal on direct                            and retroflexion views. Recommendation:           - Patient has a contact number available for                            emergencies. The signs and symptoms of potential                            delayed complications were discussed with the                            patient. Return to normal activities tomorrow.                            Written discharge instructions were  provided to the                            patient.                           - Resume previous diet.                           - Continue present medications.                           - Await pathology results.                           - Repeat colonoscopy my be recommended for                            surveillance. The colonoscopy date will be                            determined after pathology results from today's  exam become available for review.                           - See the other procedure note for documentation of                            additional recommendations. Rony Ratz L. Loletha Carrow, MD 08/31/2020 4:05:34 PM This report has been signed electronically.

## 2020-08-31 NOTE — Progress Notes (Signed)
Called to room to assist during endoscopic procedure.  Patient ID and intended procedure confirmed with present staff. Received instructions for my participation in the procedure from the performing physician.  

## 2020-08-31 NOTE — Progress Notes (Signed)
Pt's states no medical or surgical changes since previsit or office visit. 

## 2020-08-31 NOTE — Patient Instructions (Signed)
Discharge instructions given. Handout on polyps. Resume previous medications. YOU HAD AN ENDOSCOPIC PROCEDURE TODAY AT [facility]:   Refer to the procedure report that was given to you for any specific questions about what was found during the examination.  If the procedure report does not answer your questions, please call your gastroenterologist to clarify.  If you requested that your care partner not be given the details of your procedure findings, then the procedure report has been included in a sealed envelope for you to review at your convenience later.  YOU SHOULD EXPECT: Some feelings of bloating in the abdomen. Passage of more gas than usual.  Walking can help get rid of the air that was put into your GI tract during the procedure and reduce the bloating. If you had a lower endoscopy (such as a colonoscopy or flexible sigmoidoscopy) you may notice spotting of blood in your stool or on the toilet paper. If you underwent a bowel prep for your procedure, you may not have a normal bowel movement for a few days.  Please Note:  You might notice some irritation and congestion in your nose or some drainage.  This is from the oxygen used during your procedure.  There is no need for concern and it should clear up in a day or so.  SYMPTOMS TO REPORT IMMEDIATELY:   Following lower endoscopy (colonoscopy or flexible sigmoidoscopy):  Excessive amounts of blood in the stool  Significant tenderness or worsening of abdominal pains  Swelling of the abdomen that is new, acute  Fever of 100F or higher   Following upper endoscopy (EGD)  Vomiting of blood or coffee ground material  New chest pain or pain under the shoulder blades  Painful or persistently difficult swallowing  New shortness of breath  Fever of 100F or higher  Black, tarry-looking stools  For urgent or emergent issues, a gastroenterologist can be reached at any hour by calling [phone]. Do not use MyChart  messaging for urgent concerns.    DIET:  We do recommend a small meal at first, but then you may proceed to your regular diet.  Drink plenty of fluids but you should avoid alcoholic beverages for 24 hours.  ACTIVITY:  You should plan to take it easy for the rest of today and you should NOT DRIVE or use heavy machinery until tomorrow (because of the sedation medicines used during the test).    FOLLOW UP: Our staff will call the number listed on your records 48-72 hours following your procedure to check on you and address any questions or concerns that you may have regarding the information given to you following your procedure. If we do not reach you, we will leave a message.  We will attempt to reach you two times.  During this call, we will ask if you have developed any symptoms of COVID 19. If you develop any symptoms (ie: fever, flu-like symptoms, shortness of breath, cough etc.) before then, please call [phone].  If you test positive for Covid 19 in the 2 weeks post procedure, please call and report this information to [place].    If any biopsies were taken you will be contacted by phone or by letter within the next 1-3 weeks.  Please call us at [phone] if you have not heard about the biopsies in 3 weeks.    SIGNATURES/CONFIDENTIALITY: You and/or your care partner have signed paperwork which will be entered into your electronic medical record.  These signatures attest  to the fact that that the information above on your After Visit Summary has been reviewed and is understood.  Full responsibility of the confidentiality of this discharge information lies with you and/or your care-partner. 

## 2020-08-31 NOTE — Op Note (Signed)
[facility] Patient Name: Kyle Clay Procedure Date: 08/31/2020 3:17 PM MRN: [id number] Endoscopist: Mallie Mussel L. Loletha Carrow , MD Age: 69 Referring MD:  Date of Birth: 20-Mar-1951 Gender: Male Account #: [id number] Procedure:                Upper GI endoscopy Indications:              Gastro-esophageal reflux disease, Follow-up of                            gastric intestinal metaplasia Medicines:                Monitored Anesthesia Care Procedure:                Pre-Anesthesia Assessment:                           - Prior to the procedure, a History and Physical                            was performed, and patient medications and                            allergies were reviewed. The patient's tolerance of                            previous anesthesia was also reviewed. The risks                            and benefits of the procedure and the sedation                            options and risks were discussed with the patient.                            All questions were answered, and informed consent                            was obtained. Prior Anticoagulants: The patient has                            taken no previous anticoagulant or antiplatelet                            agents. ASA Grade Assessment: II - A patient with                            mild systemic disease. After reviewing the risks                            and benefits, the patient was deemed in                            satisfactory condition to undergo the procedure.  After obtaining informed consent, the endoscope was                            passed under direct vision. Throughout the                            procedure, the patient's blood pressure, pulse, and                            oxygen saturations were monitored continuously. The                            Endoscope was introduced through the mouth, and                            advanced to the second part of  duodenum. The upper                            GI endoscopy was accomplished without difficulty.                            The patient tolerated the procedure well. Scope In: Scope Out: Findings:                 The esophagus was normal.                           Two diminutive flat , whitish,mucosal papules                            (nodules) were found in the gastric antrum.                            Biopsies were taken with a cold forceps for                            histology.                           Nodular mucosa was found in the gastric antrum.                            Biopsies were taken with a cold forceps for                            histology. ([place] protocol).                           A few sessile fundic gland polyps were found in the                            gastric body.                           The cardia and gastric fundus were normal on  retroflexion.                           The examined duodenum was normal. Complications:            No immediate complications. Estimated Blood Loss:     Estimated blood loss was minimal. Impression:               - Normal esophagus.                           - Two mucosal papules (nodules) found in the                            stomach. Biopsied.                           - Nodular mucosa in the gastric antrum. Biopsied.                           - A few fundic gland polyps.                           - Normal examined duodenum. Recommendation:           - Patient has a contact number available for                            emergencies. The signs and symptoms of potential                            delayed complications were discussed with the                            patient. Return to normal activities tomorrow.                            Written discharge instructions were provided to the                            patient.                           - Resume previous diet.                            - Continue present medications.                           - Await pathology results.                           - Repeat upper endoscopy TBD after path reports for                            surveillance. Adren Dollins L. Loletha Carrow, MD 08/31/2020 4:10:52 PM This report has been signed electronically.

## 2020-08-31 NOTE — Progress Notes (Signed)
pt tolerated well. VSS. awake and to recovery. Report given to RN.  

## 2020-09-14 ENCOUNTER — Encounter: Payer: Self-pay | Admitting: Gastroenterology

## 2021-12-28 ENCOUNTER — Other Ambulatory Visit: Payer: Self-pay | Admitting: Otolaryngology

## 2021-12-28 DIAGNOSIS — H9122 Sudden idiopathic hearing loss, left ear: Secondary | ICD-10-CM

## 2023-08-02 ENCOUNTER — Encounter: Payer: Self-pay | Admitting: Gastroenterology
